# Patient Record
Sex: Male | Born: 1990 | Race: Black or African American | Hispanic: No | Marital: Single | Smoking: Never smoker
Health system: Southern US, Community
[De-identification: ages and names within clinical notes are randomized; demographics above are authoritative.]

## PROBLEM LIST (undated history)

## (undated) HISTORY — PX: WISDOM TOOTH EXTRACTION: SHX21

---

## 2014-09-27 ENCOUNTER — Ambulatory Visit (INDEPENDENT_AMBULATORY_CARE_PROVIDER_SITE_OTHER): Payer: Managed Care, Other (non HMO) | Admitting: Urgent Care

## 2014-09-27 VITALS — BP 130/88 | HR 62 | Temp 98.3°F | Resp 16 | Ht 62.5 in | Wt 176.0 lb

## 2014-09-27 DIAGNOSIS — R030 Elevated blood-pressure reading, without diagnosis of hypertension: Secondary | ICD-10-CM

## 2014-09-27 DIAGNOSIS — J020 Streptococcal pharyngitis: Secondary | ICD-10-CM

## 2014-09-27 DIAGNOSIS — J029 Acute pharyngitis, unspecified: Secondary | ICD-10-CM | POA: Diagnosis not present

## 2014-09-27 LAB — POCT RAPID STREP A (OFFICE): RAPID STREP A SCREEN: POSITIVE — AB

## 2014-09-27 MED ORDER — AMOXICILLIN 400 MG/5ML PO SUSR: 800.0000 mg | Freq: Two times a day (BID) | ORAL | Status: DC

## 2014-09-27 NOTE — Progress Notes (Signed)
    MRN: [id number] DOB: 19-Feb-1990  Subjective:   Tim Fox is a 24 y.o. male presenting for chief complaint of Sore Throat; Chills; and Dizziness  Sore throat - reports 2 day history of sore throat, slight non-productive cough, intermittent neck pain, fatigue. Has tried Tylenol with some relief. Denies fever, itchy or watery red eyes, sinus pain, ear pain, ear drainage, tooth pain, cough, chest pain, shob, wheezing, n/v, abdominal pain. Denies smoking, ~4 drinks of alcohol per week.   Elevated BP - has a history of elevated blood pressure. States that he has gotten down to exercise which she has not been doing for the past several months. He admits to really and healthy diet as well. ROS as above, also denies lower leg swelling, hematuria, chronic headaches, double vision, dizziness, numbness or tingling. Denies family history of heart disease, hypertension, diabetes.  Denies any other aggravating or relieving factors, no other questions or concerns.  Naseem currently has no medications in their medication list. Also has No Known Allergies.  Crescencio  has no past medical history on file. Also  has no past surgical history on file.  Objective:   Vitals: BP 166/116 mmHg  Pulse 62  Temp(Src) 98.3 F (36.8 C) (Oral)  Resp 16  Ht 5' 2.5" (1.588 m)  Wt 176 lb (79.833 kg)  BMI 31.66 kg/m2  SpO2 99%  Physical Exam  Constitutional: He is oriented to person, place, and time. He appears well-developed and well-nourished.  HENT:  TM's intact bilaterally, no effusions or erythema. Nasal turbinates pink and moist without sinus tenderness. Unable to visualize throat due severe gag reflex with tongue depressor. Patient does have redundant soft palate.  Eyes: Conjunctivae are normal. Right eye exhibits no discharge. Left eye exhibits no discharge. No scleral icterus.  Neck: Normal range of motion. Neck supple. No thyromegaly present.  Cardiovascular: Normal rate, regular rhythm and intact  distal pulses.  Exam reveals no gallop and no friction rub.   No murmur heard. Pulmonary/Chest: No respiratory distress. He has no wheezes. He has no rales.  Musculoskeletal: He exhibits no edema or tenderness.  Lymphadenopathy:    He has no cervical adenopathy.  Neurological: He is alert and oriented to person, place, and time.  Skin: Skin is warm and dry. No rash noted. No erythema. No pallor.   Results for orders placed or performed in visit on 09/27/14 (from the past 24 hour(s))  POCT rapid strep A     Status: Abnormal   Collection Time: 09/27/14  9:30 AM  Result Value Ref Range   Rapid Strep A Screen Positive (A) Negative   Assessment and Plan :   1. Strep throat 2. Sore throat - Start amoxicillin, patient cannot use pills so I rx'ed liquid amoxicillin. - Follow up as needed.  3. Elevated blood pressure reading without diagnosis of hypertension - Patient declined medication for now. States he will f/u with his PCP.  Wallis Bamberg, PA-C [facility] [phone] 09/27/2014 8:45 AM

## 2014-09-27 NOTE — Patient Instructions (Signed)

## 2017-09-15 ENCOUNTER — Emergency Department (HOSPITAL_COMMUNITY): Payer: 59

## 2017-09-15 ENCOUNTER — Emergency Department (HOSPITAL_COMMUNITY): Payer: 59 | Admitting: Anesthesiology

## 2017-09-15 ENCOUNTER — Observation Stay (HOSPITAL_COMMUNITY)
Admission: EM | Admit: 2017-09-15 | Discharge: 2017-09-16 | Disposition: A | Discharge status: Home / Self Care | Payer: 59 | Attending: Surgery | Admitting: Surgery

## 2017-09-15 ENCOUNTER — Encounter (HOSPITAL_COMMUNITY): Payer: Self-pay | Admitting: Emergency Medicine

## 2017-09-15 ENCOUNTER — Encounter (HOSPITAL_COMMUNITY)
Admission: EM | Disposition: A | Discharge status: Home / Self Care | Payer: Self-pay | Source: Home / Self Care | Attending: Emergency Medicine

## 2017-09-15 DIAGNOSIS — K3580 Unspecified acute appendicitis: Principal | ICD-10-CM | POA: Insufficient documentation

## 2017-09-15 DIAGNOSIS — K381 Appendicular concretions: Secondary | ICD-10-CM | POA: Diagnosis not present

## 2017-09-15 DIAGNOSIS — Z9049 Acquired absence of other specified parts of digestive tract: Secondary | ICD-10-CM

## 2017-09-15 DIAGNOSIS — Z23 Encounter for immunization: Secondary | ICD-10-CM | POA: Insufficient documentation

## 2017-09-15 HISTORY — PX: LAPAROSCOPIC APPENDECTOMY: SHX408

## 2017-09-15 LAB — CBC
HEMATOCRIT: 39.2 % (ref 39.0–52.0)
Hemoglobin: 13 g/dL (ref 13.0–17.0)
MCH: 30.5 pg (ref 26.0–34.0)
MCHC: 33.2 g/dL (ref 30.0–36.0)
MCV: 92 fL (ref 78.0–100.0)
Platelets: 358 10*3/uL (ref 150–400)
RBC: 4.26 MIL/uL (ref 4.22–5.81)
RDW: 12.6 % (ref 11.5–15.5)
WBC: 8.8 10*3/uL (ref 4.0–10.5)

## 2017-09-15 LAB — COMPREHENSIVE METABOLIC PANEL
ALBUMIN: 3.8 g/dL (ref 3.5–5.0)
ALT: 12 U/L (ref 0–44)
AST: 20 U/L (ref 15–41)
Alkaline Phosphatase: 70 U/L (ref 38–126)
Anion gap: 10 (ref 5–15)
BILIRUBIN TOTAL: 0.6 mg/dL (ref 0.3–1.2)
BUN: 11 mg/dL (ref 6–20)
CO2: 26 mmol/L (ref 22–32)
Calcium: 9.4 mg/dL (ref 8.9–10.3)
Chloride: 105 mmol/L (ref 98–111)
Creatinine, Ser: 1.15 mg/dL (ref 0.61–1.24)
GFR calc Af Amer: >60 mL/min (ref >60)
GLUCOSE: 82 mg/dL (ref 70–99)
POTASSIUM: 3.7 mmol/L (ref 3.5–5.1)
Sodium: 141 mmol/L (ref 135–145)
TOTAL PROTEIN: 7 g/dL (ref 6.5–8.1)

## 2017-09-15 LAB — URINALYSIS, ROUTINE W REFLEX MICROSCOPIC
Bilirubin Urine: NEGATIVE
Glucose, UA: NEGATIVE mg/dL
Hgb urine dipstick: NEGATIVE
KETONES UR: NEGATIVE mg/dL
LEUKOCYTES UA: NEGATIVE
NITRITE: NEGATIVE
PH: 5 (ref 5.0–8.0)
Protein, ur: NEGATIVE mg/dL
Specific Gravity, Urine: 1.024 (ref 1.005–1.030)

## 2017-09-15 LAB — LIPASE, BLOOD: Lipase: 27 U/L (ref 11–51)

## 2017-09-15 SURGERY — APPENDECTOMY, LAPAROSCOPIC
Anesthesia: General | Site: Abdomen

## 2017-09-15 MED ORDER — FENTANYL CITRATE (PF) 250 MCG/5ML IJ SOLN
INTRAMUSCULAR | Status: DC | PRN
  Administered 2017-09-15 (×4): 50 ug via INTRAVENOUS

## 2017-09-15 MED ORDER — DOCUSATE SODIUM 100 MG PO CAPS
200.0000 mg | ORAL_CAPSULE | Freq: Two times a day (BID) | ORAL | Status: DC
  Filled 2017-09-15: qty 2

## 2017-09-15 MED ORDER — DIPHENHYDRAMINE HCL 50 MG/ML IJ SOLN: 12.5000 mg | Freq: Four times a day (QID) | INTRAMUSCULAR | Status: DC | PRN

## 2017-09-15 MED ORDER — ONDANSETRON 4 MG PO TBDP: 4.0000 mg | ORAL_TABLET | Freq: Four times a day (QID) | ORAL | Status: DC | PRN

## 2017-09-15 MED ORDER — ONDANSETRON HCL 4 MG/2ML IJ SOLN: 4.0000 mg | Freq: Four times a day (QID) | INTRAMUSCULAR | Status: DC | PRN

## 2017-09-15 MED ORDER — IBUPROFEN 100 MG/5ML PO SUSP
600.0000 mg | Freq: Four times a day (QID) | ORAL | Status: DC | PRN
  Filled 2017-09-15: qty 30

## 2017-09-15 MED ORDER — ONDANSETRON HCL 4 MG/2ML IJ SOLN
INTRAMUSCULAR | Status: AC
  Filled 2017-09-15: qty 2

## 2017-09-15 MED ORDER — SIMETHICONE 80 MG PO CHEW: 40.0000 mg | CHEWABLE_TABLET | Freq: Four times a day (QID) | ORAL | Status: DC | PRN

## 2017-09-15 MED ORDER — ROCURONIUM BROMIDE 100 MG/10ML IV SOLN
INTRAVENOUS | Status: DC | PRN
  Administered 2017-09-15: 40 mg via INTRAVENOUS

## 2017-09-15 MED ORDER — GLYCOPYRROLATE PF 0.2 MG/ML IJ SOSY
PREFILLED_SYRINGE | INTRAMUSCULAR | Status: AC
  Filled 2017-09-15: qty 4

## 2017-09-15 MED ORDER — PROPOFOL 10 MG/ML IV BOLUS
INTRAVENOUS | Status: DC | PRN
  Administered 2017-09-15: 120 mg via INTRAVENOUS

## 2017-09-15 MED ORDER — MIDAZOLAM HCL 2 MG/2ML IJ SOLN
INTRAMUSCULAR | Status: AC
  Filled 2017-09-15: qty 2

## 2017-09-15 MED ORDER — HYDROMORPHONE HCL 1 MG/ML IJ SOLN: 0.2500 mg | INTRAMUSCULAR | Status: DC | PRN

## 2017-09-15 MED ORDER — HYDROMORPHONE HCL 1 MG/ML IJ SOLN: 0.5000 mg | INTRAMUSCULAR | Status: DC | PRN

## 2017-09-15 MED ORDER — FENTANYL CITRATE (PF) 250 MCG/5ML IJ SOLN
INTRAMUSCULAR | Status: AC
  Filled 2017-09-15: qty 5

## 2017-09-15 MED ORDER — SUCCINYLCHOLINE CHLORIDE 20 MG/ML IJ SOLN
INTRAMUSCULAR | Status: DC | PRN
  Administered 2017-09-15: 120 mg via INTRAVENOUS

## 2017-09-15 MED ORDER — PHENYLEPHRINE 40 MCG/ML (10ML) SYRINGE FOR IV PUSH (FOR BLOOD PRESSURE SUPPORT)
PREFILLED_SYRINGE | INTRAVENOUS | Status: AC
  Filled 2017-09-15: qty 10

## 2017-09-15 MED ORDER — EPHEDRINE 5 MG/ML INJ
INTRAVENOUS | Status: AC
  Filled 2017-09-15: qty 10

## 2017-09-15 MED ORDER — 0.9 % SODIUM CHLORIDE (POUR BTL) OPTIME
TOPICAL | Status: DC | PRN
  Administered 2017-09-15: 1000 mL

## 2017-09-15 MED ORDER — EPHEDRINE SULFATE 50 MG/ML IJ SOLN
INTRAMUSCULAR | Status: DC | PRN
  Administered 2017-09-15: 5 mg via INTRAVENOUS

## 2017-09-15 MED ORDER — ONDANSETRON HCL 4 MG/2ML IJ SOLN
INTRAMUSCULAR | Status: AC
  Filled 2017-09-15: qty 4

## 2017-09-15 MED ORDER — ONDANSETRON HCL 4 MG/2ML IJ SOLN
INTRAMUSCULAR | Status: DC | PRN
  Administered 2017-09-15: 4 mg via INTRAVENOUS

## 2017-09-15 MED ORDER — SUCCINYLCHOLINE CHLORIDE 200 MG/10ML IV SOSY
PREFILLED_SYRINGE | INTRAVENOUS | Status: AC
  Filled 2017-09-15: qty 10

## 2017-09-15 MED ORDER — MEPERIDINE HCL 50 MG/ML IJ SOLN: 6.2500 mg | INTRAMUSCULAR | Status: DC | PRN

## 2017-09-15 MED ORDER — SODIUM CHLORIDE 0.9 % IV SOLN
2.0000 g | Freq: Once | INTRAVENOUS | Status: AC
  Administered 2017-09-15: 2 g via INTRAVENOUS
  Filled 2017-09-15: qty 20

## 2017-09-15 MED ORDER — METRONIDAZOLE IN NACL 5-0.79 MG/ML-% IV SOLN
500.0000 mg | Freq: Once | INTRAVENOUS | Status: AC
  Administered 2017-09-15: 500 mg via INTRAVENOUS
  Filled 2017-09-15: qty 100

## 2017-09-15 MED ORDER — BUPIVACAINE-EPINEPHRINE 0.25% -1:200000 IJ SOLN
INTRAMUSCULAR | Status: AC
  Filled 2017-09-15: qty 1

## 2017-09-15 MED ORDER — LIDOCAINE HCL (CARDIAC) PF 100 MG/5ML IV SOSY
PREFILLED_SYRINGE | INTRAVENOUS | Status: DC | PRN
  Administered 2017-09-15: 100 mg via INTRATRACHEAL

## 2017-09-15 MED ORDER — ARTIFICIAL TEARS OPHTHALMIC OINT
TOPICAL_OINTMENT | OPHTHALMIC | Status: AC
  Filled 2017-09-15: qty 3.5

## 2017-09-15 MED ORDER — ONDANSETRON HCL 4 MG/2ML IJ SOLN: 4.0000 mg | Freq: Once | INTRAMUSCULAR | Status: DC | PRN

## 2017-09-15 MED ORDER — EPHEDRINE 5 MG/ML INJ
INTRAVENOUS | Status: AC
  Filled 2017-09-15: qty 20

## 2017-09-15 MED ORDER — SUGAMMADEX SODIUM 200 MG/2ML IV SOLN
INTRAVENOUS | Status: DC | PRN
  Administered 2017-09-15: 200 mg via INTRAVENOUS

## 2017-09-15 MED ORDER — DEXAMETHASONE SODIUM PHOSPHATE 10 MG/ML IJ SOLN
INTRAMUSCULAR | Status: DC | PRN
  Administered 2017-09-15: 10 mg via INTRAVENOUS

## 2017-09-15 MED ORDER — MIDAZOLAM HCL 5 MG/5ML IJ SOLN
INTRAMUSCULAR | Status: DC | PRN
  Administered 2017-09-15: 2 mg via INTRAVENOUS

## 2017-09-15 MED ORDER — SODIUM CHLORIDE 0.9 % IR SOLN
Status: DC | PRN
  Administered 2017-09-15: 1000 mL

## 2017-09-15 MED ORDER — HEPARIN SODIUM (PORCINE) 5000 UNIT/ML IJ SOLN
5000.0000 [IU] | Freq: Three times a day (TID) | INTRAMUSCULAR | Status: DC
  Administered 2017-09-16: 5000 [IU] via SUBCUTANEOUS

## 2017-09-15 MED ORDER — LIDOCAINE 2% (20 MG/ML) 5 ML SYRINGE
INTRAMUSCULAR | Status: AC
  Filled 2017-09-15: qty 5

## 2017-09-15 MED ORDER — ROCURONIUM BROMIDE 50 MG/5ML IV SOSY
PREFILLED_SYRINGE | INTRAVENOUS | Status: AC
  Filled 2017-09-15: qty 5

## 2017-09-15 MED ORDER — DIPHENHYDRAMINE HCL 12.5 MG/5ML PO ELIX: 12.5000 mg | ORAL_SOLUTION | Freq: Four times a day (QID) | ORAL | Status: DC | PRN

## 2017-09-15 MED ORDER — ACETAMINOPHEN 160 MG/5ML PO SOLN
650.0000 mg | Freq: Four times a day (QID) | ORAL | Status: DC
  Administered 2017-09-16: 650 mg via ORAL
  Filled 2017-09-15: qty 20.3

## 2017-09-15 MED ORDER — PROPOFOL 10 MG/ML IV BOLUS
INTRAVENOUS | Status: AC
  Filled 2017-09-15: qty 40

## 2017-09-15 MED ORDER — ROCURONIUM BROMIDE 50 MG/5ML IV SOSY
PREFILLED_SYRINGE | INTRAVENOUS | Status: AC
  Filled 2017-09-15: qty 10

## 2017-09-15 MED ORDER — LIDOCAINE 2% (20 MG/ML) 5 ML SYRINGE
INTRAMUSCULAR | Status: AC
  Filled 2017-09-15: qty 10

## 2017-09-15 MED ORDER — IOHEXOL 300 MG/ML  SOLN
100.0000 mL | Freq: Once | INTRAMUSCULAR | Status: AC | PRN
  Administered 2017-09-15: 100 mL via INTRAVENOUS

## 2017-09-15 MED ORDER — LACTATED RINGERS IV SOLN
INTRAVENOUS | Status: DC | PRN
  Administered 2017-09-15 (×2): via INTRAVENOUS

## 2017-09-15 MED ORDER — HYDRALAZINE HCL 20 MG/ML IJ SOLN: 10.0000 mg | INTRAMUSCULAR | Status: DC | PRN

## 2017-09-15 MED ORDER — BUPIVACAINE-EPINEPHRINE 0.25% -1:200000 IJ SOLN
INTRAMUSCULAR | Status: DC | PRN
  Administered 2017-09-15: 20 mL

## 2017-09-15 SURGICAL SUPPLY — 38 items
APPLIER CLIP 5 13 M/L LIGAMAX5 (MISCELLANEOUS)
BLADE CLIPPER SURG (BLADE) ×3 IMPLANT
CANISTER SUCT 3000ML PPV (MISCELLANEOUS) ×3 IMPLANT
CHLORAPREP W/TINT 26ML (MISCELLANEOUS) ×3 IMPLANT
CLIP APPLIE 5 13 M/L LIGAMAX5 (MISCELLANEOUS) IMPLANT
COVER SURGICAL LIGHT HANDLE (MISCELLANEOUS) ×3 IMPLANT
CUTTER FLEX LINEAR 45M (STAPLE) ×3 IMPLANT
DERMABOND ADVANCED (GAUZE/BANDAGES/DRESSINGS) ×2
DERMABOND ADVANCED .7 DNX12 (GAUZE/BANDAGES/DRESSINGS) ×1 IMPLANT
ELECT REM PT RETURN 9FT ADLT (ELECTROSURGICAL) ×3
ELECTRODE REM PT RTRN 9FT ADLT (ELECTROSURGICAL) ×1 IMPLANT
GLOVE BIO SURGEON STRL SZ7.5 (GLOVE) ×3 IMPLANT
GLOVE INDICATOR 8.0 STRL GRN (GLOVE) ×3 IMPLANT
GOWN STRL REUS W/ TWL LRG LVL3 (GOWN DISPOSABLE) ×2 IMPLANT
GOWN STRL REUS W/ TWL XL LVL3 (GOWN DISPOSABLE) ×1 IMPLANT
GOWN STRL REUS W/TWL LRG LVL3 (GOWN DISPOSABLE) ×4
GOWN STRL REUS W/TWL XL LVL3 (GOWN DISPOSABLE) ×2
KIT BASIN OR (CUSTOM PROCEDURE TRAY) ×3 IMPLANT
KIT TURNOVER KIT B (KITS) ×3 IMPLANT
NS IRRIG 1000ML POUR BTL (IV SOLUTION) ×3 IMPLANT
PAD ARMBOARD 7.5X6 YLW CONV (MISCELLANEOUS) ×6 IMPLANT
POUCH SPECIMEN RETRIEVAL 10MM (ENDOMECHANICALS) ×3 IMPLANT
RELOAD 45 VASCULAR/THIN (ENDOMECHANICALS) IMPLANT
RELOAD STAPLE TA45 3.5 REG BLU (ENDOMECHANICALS) IMPLANT
SCISSORS LAP 5X35 DISP (ENDOMECHANICALS) IMPLANT
SET IRRIG TUBING LAPAROSCOPIC (IRRIGATION / IRRIGATOR) ×3 IMPLANT
SHEARS HARMONIC ACE PLUS 36CM (ENDOMECHANICALS) IMPLANT
SLEEVE ENDOPATH XCEL 5M (ENDOMECHANICALS) ×3 IMPLANT
SPECIMEN JAR SMALL (MISCELLANEOUS) ×3 IMPLANT
SUT MNCRL AB 4-0 PS2 18 (SUTURE) ×3 IMPLANT
TOWEL OR 17X24 6PK STRL BLUE (TOWEL DISPOSABLE) ×3 IMPLANT
TOWEL OR 17X26 10 PK STRL BLUE (TOWEL DISPOSABLE) ×3 IMPLANT
TRAY FOLEY CATH SILVER 16FR (SET/KITS/TRAYS/PACK) ×3 IMPLANT
TRAY LAPAROSCOPIC MC (CUSTOM PROCEDURE TRAY) ×3 IMPLANT
TROCAR XCEL BLUNT TIP 100MML (ENDOMECHANICALS) ×3 IMPLANT
TROCAR XCEL NON-BLD 5MMX100MML (ENDOMECHANICALS) ×3 IMPLANT
TUBING INSUFFLATION (TUBING) ×3 IMPLANT
WATER STERILE IRR 1000ML POUR (IV SOLUTION) ×3 IMPLANT

## 2017-09-15 NOTE — ED Notes (Signed)
Patient transported to CT 

## 2017-09-15 NOTE — H&P (Signed)
CC: RLQ pain; consulted by EDP for acute appendicitis  HPI: Tim Fox is an 27 y.o. male who denies any prior medical hx presented to the ED with a 3d hx of RLQ abdominal pain. Wasn't getting worse but wasn't getting better so he came in at the urging of his boss for further evaluation. Denies fevers; +chills. Denies n/v. Never had this before. The pain does not radiate. The pain is somewhat sharp. Nothing makes it better/worse.  He denies ever having had any surgeries before  History reviewed. No pertinent past medical history.  History reviewed. No pertinent surgical history.  Family History  Problem Relation Age of Onset  . Diabetes Maternal Grandmother     Social:  reports that he has never smoked. He has never used smokeless tobacco. He reports that he drinks about 4.0 standard drinks of alcohol per week. He reports that he does not use drugs.  He works in [employer], corporate locally  Allergies: No Known Allergies  Medications: I have reviewed the patient's current medications.  Results for orders placed or performed during the hospital encounter of 09/15/17 (from the past 48 hour(s))  Lipase, blood     Status: None   Collection Time: 09/15/17  3:50 PM  Result Value Ref Range   Lipase 27 11 - 51 U/L    Comment: Performed at [facility], [place] [address], [place], [place] [address]  Comprehensive metabolic panel     Status: None   Collection Time: 09/15/17  3:50 PM  Result Value Ref Range   Sodium 141 135 - 145 mmol/L   Potassium 3.7 3.5 - 5.1 mmol/L   Chloride 105 98 - 111 mmol/L   CO2 26 22 - 32 mmol/L   Glucose, Bld 82 70 - 99 mg/dL   BUN 11 6 - 20 mg/dL   Creatinine, Ser 1.15 0.61 - 1.24 mg/dL   Calcium 9.4 8.9 - 10.3 mg/dL   Total Protein 7.0 6.5 - 8.1 g/dL   Albumin 3.8 3.5 - 5.0 g/dL   AST 20 15 - 41 U/L   ALT 12 0 - 44 U/L   Alkaline Phosphatase 70 38 - 126 U/L   Total Bilirubin 0.6 0.3 - 1.2 mg/dL   GFR calc non Af Amer >60 >60 mL/min   GFR  calc Af Amer >60 >60 mL/min    Comment: (NOTE) The eGFR has been calculated using the CKD EPI equation. This calculation has not been validated in all clinical situations. eGFR's persistently <60 mL/min signify possible Chronic Kidney Disease.    Anion gap 10 5 - 15    Comment: Performed at [facility] [address], [place] [address]  CBC     Status: None   Collection Time: 09/15/17  3:50 PM  Result Value Ref Range   WBC 8.8 4.0 - 10.5 K/uL   RBC 4.26 4.22 - 5.81 MIL/uL   Hemoglobin 13.0 13.0 - 17.0 g/dL   HCT 39.2 39.0 - 52.0 %   MCV 92.0 78.0 - 100.0 fL   MCH 30.5 26.0 - 34.0 pg   MCHC 33.2 30.0 - 36.0 g/dL   RDW 12.6 11.5 - 15.5 %   Platelets 358 150 - 400 K/uL    Comment: Performed at [facility] [address], [place], [place] [address]  Urinalysis, Routine w reflex microscopic     Status: None   Collection Time: 09/15/17  5:40 PM  Result Value Ref Range   Color,  Urine YELLOW YELLOW   APPearance CLEAR CLEAR   Specific Gravity, Urine 1.024 1.005 - 1.030   pH 5.0 5.0 - 8.0   Glucose, UA NEGATIVE NEGATIVE mg/dL   Hgb urine dipstick NEGATIVE NEGATIVE   Bilirubin Urine NEGATIVE NEGATIVE   Ketones, ur NEGATIVE NEGATIVE mg/dL   Protein, ur NEGATIVE NEGATIVE mg/dL   Nitrite NEGATIVE NEGATIVE   Leukocytes, UA NEGATIVE NEGATIVE    Comment: Performed at [facility] [address], [place], [place] [address]    Ct Abdomen Pelvis W Contrast  Result Date: 09/15/2017 CLINICAL DATA:  Right lower quadrant abdominal pain starting on Monday. EXAM: CT ABDOMEN AND PELVIS WITH CONTRAST TECHNIQUE: Multidetector CT imaging of the abdomen and pelvis was performed using the standard protocol following bolus administration of intravenous contrast. CONTRAST:  191m OMNIPAQUE IOHEXOL 300 MG/ML  SOLN COMPARISON:  None. FINDINGS: Lower chest: Mild cardiomegaly. Hepatobiliary: Unremarkable Pancreas: Unremarkable Spleen: Unremarkable Adrenals/Urinary Tract:  Unremarkable Stomach/Bowel: Acute appendicitis is present with appendiceal diameter of 2.1 cm, and with a 1.0 cm appendicolith in the appendiceal tip. There is inflammatory periappendiceal stranding and the degree of inflammation is most pronounced near the appendiceal tip. Small amount of periappendiceal fluid. No discrete abscess or extraluminal gas. Vascular/Lymphatic: Unremarkable Reproductive: Unremarkable Other: No supplemental non-categorized findings. Musculoskeletal: Unremarkable IMPRESSION: 1. Acute appendicitis, with prominent inflammatory thickening of the distal appendix and a notable appendicolith near the appendiceal tip. There is surrounding periappendiceal inflammatory stranding and a small amount of adjacent fluid, but no extraluminal gas or abscess. 2. Mild cardiomegaly. Electronically Signed   By: WVan ClinesM.D.   On: 09/15/2017 20:02    ROS - all of the below systems have been reviewed with the patient and positives are indicated with bold text General: chills, fever or night sweats Eyes: blurry vision or double vision ENT: epistaxis or sore throat Allergy/Immunology: itchy/watery eyes or nasal congestion Hematologic/Lymphatic: bleeding problems, blood clots or swollen lymph nodes Endocrine: temperature intolerance or unexpected weight changes Breast: new or changing breast lumps or nipple discharge Resp: cough, shortness of breath, or wheezing CV: chest pain or dyspnea on exertion GI: as per HPI GU: dysuria, trouble voiding, or hematuria MSK: joint pain or joint stiffness Neuro: TIA or stroke symptoms Derm: pruritus and skin lesion changes Psych: anxiety and depression  PE Blood pressure (!) 155/107, pulse 63, temperature 98.2 F (36.8 C), temperature source Oral, resp. rate 16, SpO2 100 %. Constitutional: NAD; conversant; no deformities Eyes: Moist conjunctiva; no lid lag; anicteric; PERRL Neck: Trachea midline; no thyromegaly Lungs: Normal respiratory  effort; no tactile fremitus CV: RRR; no palpable thrills; no pitting edema GI: Abd soft, mildly ttp in RLQ; negative Rovsing's; negative Psoas sign; no rebound, no guarding, no tenderness elsewhere; no palpable hepatosplenomegaly. Nondistended MSK: Normal gait; no clubbing/cyanosis Psychiatric: Appropriate affect; alert and oriented x3 Lymphatic: No palpable cervical or axillary lymphadenopathy  Results for orders placed or performed during the hospital encounter of 09/15/17 (from the past 48 hour(s))  Lipase, blood     Status: None   Collection Time: 09/15/17  3:50 PM  Result Value Ref Range   Lipase 27 11 - 51 U/L    Comment: Performed at [facility] [id number] [place] [id number] Comprehensive metabolic panel     Status: None   Collection Time: 09/15/17  3:50 PM  Result Value Ref Range   Sodium 141 135 - 145 mmol/L   Potassium 3.7 3.5 - 5.1 mmol/L  Chloride 105 98 - 111 mmol/L   CO2 26 22 - 32 mmol/L   Glucose, Bld 82 70 - 99 mg/dL   BUN 11 6 - 20 mg/dL   Creatinine, Ser 1.15 0.61 - 1.24 mg/dL   Calcium 9.4 8.9 - 10.3 mg/dL   Total Protein 7.0 6.5 - 8.1 g/dL   Albumin 3.8 3.5 - 5.0 g/dL   AST 20 15 - 41 U/L   ALT 12 0 - 44 U/L   Alkaline Phosphatase 70 38 - 126 U/L   Total Bilirubin 0.6 0.3 - 1.2 mg/dL   GFR calc non Af Amer >60 >60 mL/min   GFR calc Af Amer >60 >60 mL/min    Comment: (NOTE) The eGFR has been calculated using the CKD EPI equation. This calculation has not been validated in all clinical situations. eGFR's persistently <60 mL/min signify possible Chronic Kidney Disease.    Anion gap 10 5 - 15    Comment: Performed at [facility] [address], [place] [address]  CBC     Status: None   Collection Time: 09/15/17  3:50 PM  Result Value Ref Range   WBC 8.8 4.0 - 10.5 K/uL   RBC 4.26 4.22 - 5.81 MIL/uL   Hemoglobin 13.0 13.0 - 17.0 g/dL   HCT 39.2 39.0 - 52.0 %   MCV 92.0 78.0 - 100.0 fL   MCH 30.5 26.0 - 34.0 pg    MCHC 33.2 30.0 - 36.0 g/dL   RDW 12.6 11.5 - 15.5 %   Platelets 358 150 - 400 K/uL    Comment: Performed at [facility] [address], [place], [place] [address]  Urinalysis, Routine w reflex microscopic     Status: None   Collection Time: 09/15/17  5:40 PM  Result Value Ref Range   Color, Urine YELLOW YELLOW   APPearance CLEAR CLEAR   Specific Gravity, Urine 1.024 1.005 - 1.030   pH 5.0 5.0 - 8.0   Glucose, UA NEGATIVE NEGATIVE mg/dL   Hgb urine dipstick NEGATIVE NEGATIVE   Bilirubin Urine NEGATIVE NEGATIVE   Ketones, ur NEGATIVE NEGATIVE mg/dL   Protein, ur NEGATIVE NEGATIVE mg/dL   Nitrite NEGATIVE NEGATIVE   Leukocytes, UA NEGATIVE NEGATIVE    Comment: Performed at [facility] [address], [place], [place] [address]    Ct Abdomen Pelvis W Contrast  Result Date: 09/15/2017 CLINICAL DATA:  Right lower quadrant abdominal pain starting on Monday. EXAM: CT ABDOMEN AND PELVIS WITH CONTRAST TECHNIQUE: Multidetector CT imaging of the abdomen and pelvis was performed using the standard protocol following bolus administration of intravenous contrast. CONTRAST:  132m OMNIPAQUE IOHEXOL 300 MG/ML  SOLN COMPARISON:  None. FINDINGS: Lower chest: Mild cardiomegaly. Hepatobiliary: Unremarkable Pancreas: Unremarkable Spleen: Unremarkable Adrenals/Urinary Tract: Unremarkable Stomach/Bowel: Acute appendicitis is present with appendiceal diameter of 2.1 cm, and with a 1.0 cm appendicolith in the appendiceal tip. There is inflammatory periappendiceal stranding and the degree of inflammation is most pronounced near the appendiceal tip. Small amount of periappendiceal fluid. No discrete abscess or extraluminal gas. Vascular/Lymphatic: Unremarkable Reproductive: Unremarkable Other: No supplemental non-categorized findings. Musculoskeletal: Unremarkable IMPRESSION: 1. Acute appendicitis, with prominent inflammatory thickening of the distal appendix and a notable appendicolith near the  appendiceal tip. There is surrounding periappendiceal inflammatory stranding and a small amount of adjacent fluid, but no extraluminal gas or abscess. 2. Mild cardiomegaly. Electronically Signed   By: WVan ClinesM.D.   On: 09/15/2017 20:02  A/P: Tim Fox is an 27 y.o. male with acute appendicitis - no evidence of perforation on imaging  -The anatomy and physiology of the GI tract was discussed at length with the patient. The pathophysiology of appendicitis was discussed at length as well. -I discussed options moving forward for treatment including observation with IV abx vs surgery. We discussed that with antibiotics alone, there is reasonable success in managing appendicitis without needing an operation but in his case, an appendicolith was seen which increases chances of antibiotic alone failure. We discussed risks of recurrence at 16yr being as high as 40% in some studies (APPAC trial in cases of acute uncomplicated appendicitis - exclusions being perforation, abscess, suspected malignancy, appenddicolith, age <18 or >60, peritonitis, serious comorbidities). We discussed the surgical procedure including laparoscopic and potential open techniques. We discussed the material risks (including, but not limited to, pain, bleeding, infection, scarring, need for blood transfusion, damage to surrounding structures- blood vessels/nerves/viscus/organs, damage to ureter/bladder, urine leak, leak from staple line, need for additional procedures, need for stoma which may be permanent, hernia, recurrence although quite low, pneumonia, heart attack, stroke, death) benefits and alternatives to surgery were discussed at length. The patient's questions were answered to his satisfaction, he voiced understanding and elected to proceed with surgery. Additionally, we discussed typical postoperative expectations and the recovery process. -Will plan laparoscopic, possible open, appendectomy  CSharon Mt  WDema Severin M.D. [facility]

## 2017-09-15 NOTE — ED Notes (Signed)
Patients belongings inventoried and brought to security per short stay request

## 2017-09-15 NOTE — Anesthesia Preprocedure Evaluation (Signed)
Anesthesia Evaluation  Patient identified by MRN, date of birth, ID band Patient awake    Reviewed: Allergy & Precautions, NPO status , Patient's Chart, lab work & pertinent test results  Airway Mallampati: I  TM Distance: >3 FB Neck ROM: Full    Dental   Pulmonary    Pulmonary exam normal        Cardiovascular Normal cardiovascular exam     Neuro/Psych    GI/Hepatic   Endo/Other    Renal/GU      Musculoskeletal   Abdominal   Peds  Hematology   Anesthesia Other Findings   Reproductive/Obstetrics                             Anesthesia Physical Anesthesia Plan  ASA: II and emergent  Anesthesia Plan: General   Post-op Pain Management:    Induction: Intravenous, Rapid sequence and Cricoid pressure planned  PONV Risk Score and Plan: 2 and Ondansetron and Treatment may vary due to age or medical condition  Airway Management Planned: Oral ETT  Additional Equipment:   Intra-op Plan:   Post-operative Plan: Extubation in OR  Informed Consent: I have reviewed the patients History and Physical, chart, labs and discussed the procedure including the risks, benefits and alternatives for the proposed anesthesia with the patient or authorized representative who has indicated his/her understanding and acceptance.     Plan Discussed with: CRNA and Surgeon  Anesthesia Plan Comments:         Anesthesia Quick Evaluation

## 2017-09-15 NOTE — ED Triage Notes (Signed)
Pt presents to ED for assessment of RLQ pain, sent from [facility] for r/o appendicitis.  Denies n/v/d, denies changes in urination.  Pain since Monday, comes and goes, worsening each time it comes.

## 2017-09-15 NOTE — ED Notes (Signed)
Dr. Cliffton Asters with [facility] bedside for evaluation. Pt requesting to walk out to his car to get his work phone before antibiotics are initiated.

## 2017-09-15 NOTE — Anesthesia Procedure Notes (Signed)
Procedure Name: Intubation Date/Time: 09/15/2017 9:54 PM Performed by: Claudina Lick, CRNA Pre-anesthesia Checklist: Patient identified, Emergency Drugs available, Suction available, Patient being monitored and Timeout performed Patient Re-evaluated:Patient Re-evaluated prior to induction Oxygen Delivery Method: Circle system utilized Preoxygenation: Pre-oxygenation with 100% oxygen Induction Type: IV induction, Rapid sequence and Cricoid Pressure applied Laryngoscope Size: Miller and 2 Grade View: Grade I Tube type: Oral Tube size: 7.5 mm Number of attempts: 1 Airway Equipment and Method: Stylet Placement Confirmation: ETT inserted through vocal cords under direct vision,  positive ETCO2 and breath sounds checked- equal and bilateral Secured at: 22 cm Tube secured with: Tape Dental Injury: Teeth and Oropharynx as per pre-operative assessment

## 2017-09-15 NOTE — ED Provider Notes (Signed)
[facility] EMERGENCY DEPARTMENT Provider Note   CSN: [id number] Arrival date & time: 09/15/17  1503     History   Chief Complaint Chief Complaint  Patient presents with  . Abdominal Pain    HPI Tim Fox is a 27 y.o. male.  HPI   27 year old male presents today with complaints of abdominal pain.  Patient has 3-day history of worsening abdominal pain started very umbilical localizing down to the right lower quadrant.  Symptoms worse with movement.  He notes decreased appetite.  He denies any nausea or vomiting, denies any fever.  No urinary symptoms, no history of abdominal surgeries.  No daily medications, no pain medications prior to arrival.  Patient notes that he did have water this morning, but no solid foods to eat today.     History reviewed. No pertinent past medical history.  Patient Active Problem List   Diagnosis Date Noted  . Elevated blood pressure reading without diagnosis of hypertension 09/27/2014    History reviewed. No pertinent surgical history.      Home Medications    Prior to Admission medications   Medication Sig Start Date End Date Taking? Authorizing Provider  amoxicillin (AMOXIL) 400 MG/5ML suspension Take 10 mLs (800 mg total) by mouth 2 (two) times daily. 09/27/14   Wallis Bamberg, PA-C    Family History Family History  Problem Relation Age of Onset  . Diabetes Maternal Grandmother     Social History Social History   Tobacco Use  . Smoking status: Never Smoker  . Smokeless tobacco: Never Used  Substance Use Topics  . Alcohol use: Yes    Alcohol/week: 4.0 standard drinks    Types: 4 Standard drinks or equivalent per week  . Drug use: No     Allergies   Patient has no known allergies.   Review of Systems Review of Systems  All other systems reviewed and are negative.   Physical Exam Updated Vital Signs BP (!) 155/107 (BP Location: Right Arm)   Pulse 63   Temp 98.2 F (36.8 C) (Oral)   Resp 16    SpO2 100%   Physical Exam  Constitutional: He is oriented to person, place, and time. He appears well-developed and well-nourished.  HENT:  Head: Normocephalic and atraumatic.  Eyes: Pupils are equal, round, and reactive to light. Conjunctivae are normal. Right eye exhibits no discharge. Left eye exhibits no discharge. No scleral icterus.  Neck: Normal range of motion. No JVD present. No tracheal deviation present.  Pulmonary/Chest: Effort normal. No stridor.  Abdominal:  Acute tenderness to palpation of the right lower quadrant remainder of abdomen soft nontender  Neurological: He is alert and oriented to person, place, and time. Coordination normal.  Psychiatric: He has a normal mood and affect. His behavior is normal. Judgment and thought content normal.  Nursing note and vitals reviewed.    ED Treatments / Results  Labs (all labs ordered are listed, but only abnormal results are displayed) Labs Reviewed  LIPASE, BLOOD  COMPREHENSIVE METABOLIC PANEL  CBC  URINALYSIS, ROUTINE W REFLEX MICROSCOPIC    EKG None  Radiology Ct Abdomen Pelvis W Contrast  Result Date: 09/15/2017 CLINICAL DATA:  Right lower quadrant abdominal pain starting on Monday. EXAM: CT ABDOMEN AND PELVIS WITH CONTRAST TECHNIQUE: Multidetector CT imaging of the abdomen and pelvis was performed using the standard protocol following bolus administration of intravenous contrast. CONTRAST:  OMNIPAQUE IOHEXOL 300 MG/ML  SOLN COMPARISON:  None. FINDINGS: Lower chest: Mild cardiomegaly. Hepatobiliary:  Unremarkable Pancreas: Unremarkable Spleen: Unremarkable Adrenals/Urinary Tract: Unremarkable Stomach/Bowel: Acute appendicitis is present with appendiceal diameter of 2.1 cm, and with a 1.0 cm appendicolith in the appendiceal tip. There is inflammatory periappendiceal stranding and the degree of inflammation is most pronounced near the appendiceal tip. Small amount of periappendiceal fluid. No discrete abscess or  extraluminal gas. Vascular/Lymphatic: Unremarkable Reproductive: Unremarkable Other: No supplemental non-categorized findings. Musculoskeletal: Unremarkable IMPRESSION: 1. Acute appendicitis, with prominent inflammatory thickening of the distal appendix and a notable appendicolith near the appendiceal tip. There is surrounding periappendiceal inflammatory stranding and a small amount of adjacent fluid, but no extraluminal gas or abscess. 2. Mild cardiomegaly. Electronically Signed   By: Gaylyn Rong M.D.   On: 09/15/2017 20:02    Procedures Procedures (including critical care time)  Medications Ordered in ED Medications  cefTRIAXone (ROCEPHIN) 2 g in sodium chloride 0.9 % 100 mL IVPB (has no administration in time range)    And  metroNIDAZOLE (FLAGYL) IVPB 500 mg (has no administration in time range)  iohexol (OMNIPAQUE) 300 MG/ML solution 100 mL (100 mLs Intravenous Contrast Given 09/15/17 1930)     Initial Impression / Assessment and Plan / ED Course  I have reviewed the triage vital signs and the nursing notes.  Pertinent labs & imaging results that were available during my care of the patient were reviewed by me and considered in my medical decision making (see chart for details).     Labs: Urinalysis, CBC, CMP, lipase  Imaging: CT abdomen pelvis with contrast  Consults:  Therapeutics: Ceftriaxone, metronidazole  Discharge Meds:   Assessment/Plan: 27 year old male presents today with acute appendicitis-no signs of perforation, afebrile with no tachycardia or elevation in white count.  Patient will be started on ceftriaxone and metronidazole, general surgery consulted who would evaluate the patient at bedside.      Final Clinical Impressions(s) / ED Diagnoses   Final diagnoses:  Acute appendicitis, unspecified acute appendicitis type    ED Discharge Orders    None       Rosalio Loud 09/15/17 2109    Maia Plan, MD 09/16/17 1314

## 2017-09-15 NOTE — Op Note (Signed)
Tim Fox [id number]   PRE-OPERATIVE DIAGNOSIS:  appendicitis  POST-OPERATIVE DIAGNOSIS:  appendicitis  Procedure(s): APPENDECTOMY LAPAROSCOPIC  SURGEON:  Stephanie Coup. Javoris Star, M.D.  ASSISTANT: Scrub nurse  ANESTHESIA: General endotracheal  EBL:   5cc  DRAINS: None  SPECIMEN:  Appendix  COUNTS:  Sponge, needle and instrument counts were reported correct x2 at conclusion of the operation  DISPOSITION:  PACU in satisfactory condition  COMPLICATIONS: None  FINDINGS: Likely prior umbilical hernia repair given retained permanent suture at this location although he denied prior surgical history and believed his skin scar around his umbilicus was from skin trauma. Dilated, inflamed appearing appendix in distal half. No perforation or murky ascites.  INDICATIONS: Tim Fox is a very pleasant 27yoM whom presented to the ED with a 3d hx of RLQ pain. He underwent evaluation in the ER and was noted to be afebrile with normal wbc. He underwent CT scan a/p which demonstrated findings acute appendicitis with prominent inflammatory thickening of the distal appendix. Options were discussed and he opted to pursue appendectomy. Please refer to H&P for details regarding this discussion.  DESCRIPTION:   The patient was identified & brought into the operating room. SCDs were in place and functioning. General endotracheal anesthesia was administered. Preoperative antibiotics were administered. The patient was positioned supine with left arm tucked. A foley catheter was inserted under sterile conditions. The abdomen was prepped and draped in the standard sterile fashion. A surgical timeout confirmed our plan.  A small incision was made in the supraumbilical skin. The subcutaneous tissue was dissected and the umbilical stalk identified. The stalk was grasped with a Kocher and retracted outwardly. The infraumbilical fascia was exposed and incised. Peritoneal entry was carefully made bluntly. An 0  Vicryl purse-string suture was placed and then the [facility] port was introduced into the abdomen.  CO2 insufflation commenced to . The laparoscope was inserted and confirmed no evidence of injury. The patient was then positioned in Trendelenburg. Two additional ports were placed - one in left lower quadrant and another in the suprapubic midline. The patient was then placed in the left side down position.  Omental adhesions to the anterior abdominal wall were taken down carefully.  The appendix was readily identified and swept off the abdominal wall where it was adhered. It was then elevated.  The base of the appendix was circumferentially dissected. The base was noted to be viable and healthy appearing. The intervening appendiceal mesentery was ligated with the Harmonic scalpel. The appendiceal mesentery was observed and noted to be completely hemostatic.  The appendix was then stapled with a blue load, taking the appendix at its base flush with the cecum. The appendix was placed in an EndoBag and extracted through the umbilical port site.  The right lower quadrant was irrigated. Hemostasis was noted to be achieved - taking time to inspect the ligated mesoappendix, colon mesentery, and retroperitoneum. Staple line was noted to be intact on the cecum with no bleeding. There was no perforation or injury. The right lower quadrant appeared clean and as such, no drain was placed. The omentum was placed back down over the cecum  The left lower quadrant and suprapubic ports were removed under direct visualization. The CO2 was exhausted from the abdomen. The umbilical fascia was then closed using 0 Vicryl suture. The fascia was palpated and noted to be closed completely. The skin of all port sites was then approximated using 4-0 Monocryl suture. The incisions were dressed with Dermabond.  The patient was then extubated  and transferred to a stretcher for transport to recover in satisfactory condition.

## 2017-09-15 NOTE — Transfer of Care (Signed)
Immediate Anesthesia Transfer of Care Note  Patient: Tim Fox  Procedure(s) Performed: APPENDECTOMY LAPAROSCOPIC (N/A Abdomen)  Patient Location: PACU  Anesthesia Type:General  Level of Consciousness: awake  Airway & Oxygen Therapy: Patient Spontanous Breathing  Post-op Assessment: Report given to RN and Post -op Vital signs reviewed and stable  Post vital signs: Reviewed and stable  Last Vitals:  Vitals Value Taken Time  BP    Temp    Pulse 84 09/15/2017 11:01 PM  Resp 15 09/15/2017 11:01 PM  SpO2 100 % 09/15/2017 11:01 PM  Vitals shown include unvalidated device data.  Last Pain:  Vitals:   09/15/17 1526  TempSrc:   PainSc: 7          Complications: No apparent anesthesia complications

## 2017-09-15 NOTE — ED Provider Notes (Signed)
Patient placed in Quick Look pathway, seen and evaluated   Chief Complaint: abdominal pain  HPI:   Jonathyn Bacus is a 27 y.o. male who presents to the ED from urgent care for abdominal pain. The pain started 3 days ago and was generalized pain. Today the pain is in the RLQ. Patient denies n/v/d. No fever.   ROS: GI: abdominal pain  Physical Exam:  BP (!) 155/107 (BP Location: Right Arm)   Pulse 63   Temp 98.2 F (36.8 C) (Oral)   Resp 16   SpO2 100%    Gen: No distress  Neuro: Awake and Alert  Skin: Warm and dry  Abdomen: soft, tender with palpation RLQ    Initiation of care has begun. The patient has been counseled on the process, plan, and necessity for staying for the completion/evaluation, and the remainder of the medical screening examination    Janne Napoleon, NP 09/15/17 1531    Vanetta Mulders, MD 09/18/17 [phone]

## 2017-09-16 ENCOUNTER — Other Ambulatory Visit: Payer: Self-pay

## 2017-09-16 ENCOUNTER — Encounter (HOSPITAL_COMMUNITY): Payer: Self-pay | Admitting: Surgery

## 2017-09-16 LAB — BASIC METABOLIC PANEL
ANION GAP: 12 (ref 5–15)
BUN: 10 mg/dL (ref 6–20)
CALCIUM: 9.3 mg/dL (ref 8.9–10.3)
CHLORIDE: 102 mmol/L (ref 98–111)
CO2: 25 mmol/L (ref 22–32)
CREATININE: 1.1 mg/dL (ref 0.61–1.24)
GFR calc non Af Amer: >60 mL/min (ref >60)
Glucose, Bld: 102 mg/dL — ABNORMAL HIGH (ref 70–99)
Potassium: 3.8 mmol/L (ref 3.5–5.1)
SODIUM: 139 mmol/L (ref 135–145)

## 2017-09-16 LAB — CBC
HCT: 39.8 % (ref 39.0–52.0)
HEMOGLOBIN: 13.4 g/dL (ref 13.0–17.0)
MCH: 30.6 pg (ref 26.0–34.0)
MCHC: 33.7 g/dL (ref 30.0–36.0)
MCV: 90.9 fL (ref 78.0–100.0)
Platelets: 364 10*3/uL (ref 150–400)
RBC: 4.38 MIL/uL (ref 4.22–5.81)
RDW: 12.5 % (ref 11.5–15.5)
WBC: 9.9 10*3/uL (ref 4.0–10.5)

## 2017-09-16 MED ORDER — OXYCODONE HCL 5 MG/5ML PO SOLN: 5.0000 mg | ORAL | Status: DC | PRN

## 2017-09-16 MED ORDER — ACETAMINOPHEN 160 MG/5ML PO SOLN: ORAL | 0 refills | Status: AC

## 2017-09-16 MED ORDER — ACETAMINOPHEN 160 MG/5ML PO SOLN: 1000.0000 mg | Freq: Three times a day (TID) | ORAL | Status: DC | PRN

## 2017-09-16 MED ORDER — INFLUENZA VAC SPLIT QUAD 0.5 ML IM SUSY
0.5000 mL | PREFILLED_SYRINGE | INTRAMUSCULAR | Status: AC
  Administered 2017-09-16: 0.5 mL via INTRAMUSCULAR
  Filled 2017-09-16: qty 0.5

## 2017-09-16 MED ORDER — OXYCODONE HCL 5 MG/5ML PO SOLN: 5.0000 mg | ORAL | 0 refills | Status: AC | PRN

## 2017-09-16 NOTE — Anesthesia Postprocedure Evaluation (Signed)
Anesthesia Post Note  Patient: Robyne PeersDesmond Novakowski  Procedure(s) Performed: APPENDECTOMY LAPAROSCOPIC (N/A Abdomen)     Patient location during evaluation: PACU Anesthesia Type: General Level of consciousness: awake and alert Pain management: pain level controlled Vital Signs Assessment: post-procedure vital signs reviewed and stable Respiratory status: spontaneous breathing, nonlabored ventilation, respiratory function stable and patient connected to nasal cannula oxygen Cardiovascular status: blood pressure returned to baseline and stable Postop Assessment: no apparent nausea or vomiting Anesthetic complications: no    Last Vitals:  Vitals:   09/15/17 2318 09/15/17 2332  BP: (!) 161/98 (!) 165/86  Pulse: 74 68  Resp: 17 17  Temp:  36.5 C  SpO2: 100% 100%    Last Pain:  Vitals:   09/15/17 2357  TempSrc:   PainSc: 0-No pain                 Otha Rickles DAVID

## 2017-09-16 NOTE — Progress Notes (Signed)
Discharge home. Home discharge instruction given, no question verbalized. 

## 2017-09-16 NOTE — Plan of Care (Signed)

## 2017-09-16 NOTE — Progress Notes (Signed)
1 Day Post-Op    CC: Acute appendicitis  Subjective: Patient is doing well after his appendectomy.  He needs a physical exam for his work.  He is doing well from his appendectomy.  Tolerating diet and ready to go home.  He says he has a history of intermittent elevated blood pressures.  He also says he cannot swallow pills.  Objective: Vital signs in last 24 hours: Temp:  [97.7 F (36.5 C)-98.2 F (36.8 C)] 98.1 F (36.7 C) (09/12 0800) Pulse Rate:  [63-79] 63 (09/12 0800) Resp:  [13-17] 17 (09/11 2332) BP: (131-179)/(79-107) 131/79 (09/12 0800) SpO2:  [99 %-100 %] 99 % (09/12 0800) Last BM Date: 09/16/17 1200 IV Nothing p.o. Recorded. 250 urine Afebrile vital signs are stable blood pressure is up on admission.  Trending towards the high side. Labs okay Creatinine on admission 1.15, today is 1.10. Intake/Output from previous day: 09/11 0701 - 09/12 0700 In: 1200 [I.V.:1200] Out: 275 [Urine:250; Blood:25] Intake/Output this shift: Total I/O In: -  Out: 250 [Urine:250]  General appearance: alert, cooperative and no distress GI: soft, sore, sites look fine.  Lab Results:  Recent Labs    09/15/17 1550 09/16/17 0434  WBC 8.8 9.9  HGB 13.0 13.4  HCT 39.2 39.8  PLT 358 364    BMET Recent Labs    09/15/17 1550 09/16/17 0434  NA 141 139  K 3.7 3.8  CL 105 102  CO2 26 25  GLUCOSE 82 102*  BUN 11 10  CREATININE 1.15 1.10  CALCIUM 9.4 9.3   PT/INR No results for input(s): LABPROT, INR in the last 72 hours.  Recent Labs  Lab 09/15/17 1550  AST 20  ALT 12  ALKPHOS 70  BILITOT 0.6  PROT 7.0  ALBUMIN 3.8     Lipase     Component Value Date/Time   LIPASE 27 09/15/2017 1550     Prior to Admission medications   Medication Sig Start Date End Date Taking? Authorizing Provider  amoxicillin (AMOXIL) 400 MG/5ML suspension Take 10 mLs (800 mg total) by mouth 2 (two) times daily. 09/27/14   Wallis BambergMani, Mario, PA-C    Medications: . acetaminophen (TYLENOL) oral  liquid 160 mg/5 mL  650 mg Oral Q6H  . docusate sodium  200 mg Oral BID  . heparin injection (subcutaneous)  5,000 Units Subcutaneous Q8H     Assessment/Plan  Acute appendicitis Laparoscopic appendectomy, 09/15/2017 Dr. Marin Olphristopher White  FEN: Regular diet ID: Ceftin Flagyl preop DVT: Heparin Follow-up: [facility]   Plan:  Home today, follow up in the [facility].        LOS: 0 days    Carmelina Balducci 09/16/2017 [phone]

## 2017-09-17 NOTE — Discharge Summary (Signed)
Physician Discharge Summary  Patient ID: Kwan Shellhammer MRN: [id number] DOB/AGE: [birth year] 27 y.o.  Admit date: 09/15/2017 Discharge date: 09/16/2017  Admission Diagnoses:  Acute appendicitis  Discharge Diagnoses:  Acute appendicitis  Active Problems:   S/P laparoscopic appendectomy   PROCEDURES: Laparoscopic appendectomy, 09/15/2017 Dr. Mills Koller Course: CC: RLQ pain; consulted by EDP for acute appendicitis HPI: Norm Wray is an 27 y.o. male who denies any prior medical hx presented to the ED with a 3d hx of RLQ abdominal pain. Wasn't getting worse but wasn't getting better so he came in at the urging of his boss for further evaluation. Denies fevers; +chills. Denies n/v. Never had this before. The pain does not radiate. The pain is somewhat sharp. Nothing makes it better/worse.  He was seen by Dr. Cliffton Asters and taken to the OR for the above procedure.  He did well post op and was discharged home the first post op day tolerating a diet and pain well controlled with PO medications.    CBC Latest Ref Rng & Units 09/16/2017 09/15/2017  WBC 4.0 - 10.5 K/uL 9.9 8.8  Hemoglobin 13.0 - 17.0 g/dL 46.9 62.9  Hematocrit 52.8 - 52.0 % 39.8 39.2  Platelets 150 - 400 K/uL 364 358   CMP Latest Ref Rng & Units 09/16/2017 09/15/2017  Glucose 70 - 99 mg/dL 413(K) 82  BUN 6 - 20 mg/dL 10 11  Creatinine 4.40 - 1.24 mg/dL 1.02 7.25  Sodium 366 - 145 mmol/L 139 141  Potassium 3.5 - 5.1 mmol/L 3.8 3.7  Chloride 98 - 111 mmol/L 102 105  CO2 22 - 32 mmol/L 25 26  Calcium 8.9 - 10.3 mg/dL 9.3 9.4  Total Protein 6.5 - 8.1 g/dL - 7.0  Total Bilirubin 0.3 - 1.2 mg/dL - 0.6  Alkaline Phos 38 - 126 U/L - 70  AST 15 - 41 U/L - 20  ALT 0 - 44 U/L - 12   Condition on DC:  Improved   Disposition:    Allergies as of 09/16/2017   No Known Allergies     Medication List    STOP taking these medications   amoxicillin 400 MG/5ML suspension Commonly known as:  AMOXIL     TAKE  these medications   acetaminophen 160 MG/5ML solution Commonly known as:  TYLENOL You can take 1000 mg of Tylenol every 6 hours as needed for pain.  You can buy this over the counter at any drugstore.   Do not exceed 4000 mg per day, it can harm your liver.   oxyCODONE 5 MG/5ML solution Commonly known as:  ROXICODONE Take 5 mLs (5 mg total) by mouth every 4 (four) hours as needed for severe pain or breakthrough pain.      Follow-up Information    [facility] Follow up on 09/30/2017.   Specialty:  General Surgery Why:  Your appointment is at 2:00  PM, be at the office 30 minutes early for check in.  Bring photo ID and insurance information. Contact information: [address] [place] [address] [phone]        Get a primary care doctor to follow your blood pressure. Follow up.   Why:  Take you blood pressure at home a couple times per day and record. Do it when you are not stressed.  Early AM and in the PM while relaxing.  Use this when you see your primary care doctor.  SignedSherrie George: Navea Woodrow 09/17/2017, 8:42 AM

## 2019-08-12 IMAGING — CT CT ABD-PELV W/ CM
2 of 4 series · 17 of 46 positions shown, 19 images · IV contrast (omnipaque)
Comparison: None.

CLINICAL DATA: Right lower quadrant abdominal pain starting on
[REDACTED].

EXAM:
CT ABDOMEN AND PELVIS WITH CONTRAST
TECHNIQUE: Multidetector CT imaging of the abdomen and pelvis was performed
using the standard protocol following bolus administration of
intravenous contrast.
CONTRAST:  100mL OMNIPAQUE IOHEXOL 300 MG/ML  SOLN

[Series 3: abd/ pelvis 5.0 i30f 2 · axial · 0.72mm/px · z∈[-366,+44]mm · 14 of 90 slices shown, 16 images]
[im 4/90  soft-tissue]
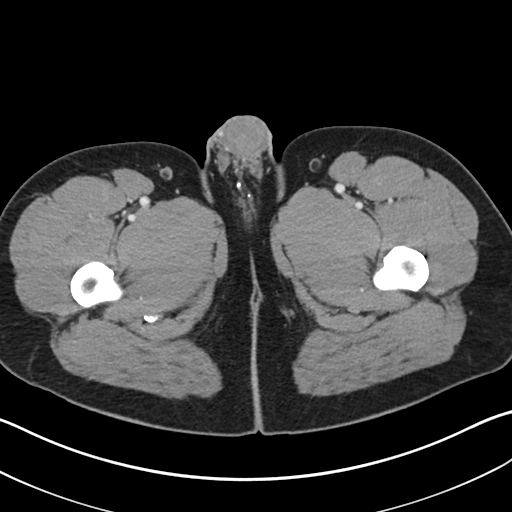
[im 4/90  bone]
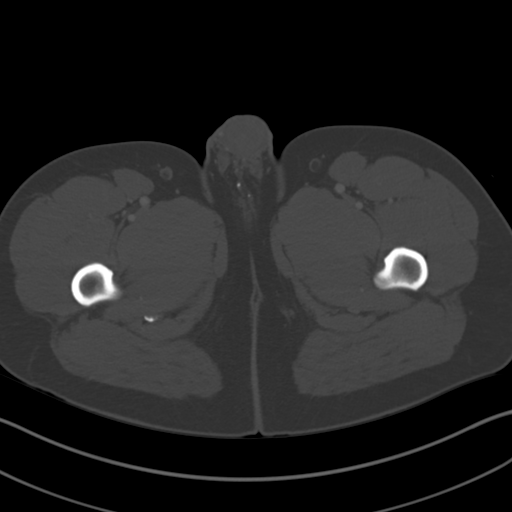
[im 11/90  soft-tissue]
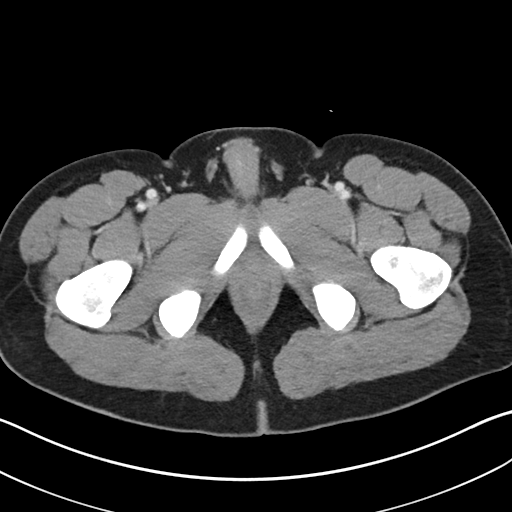
[im 18/90  soft-tissue]
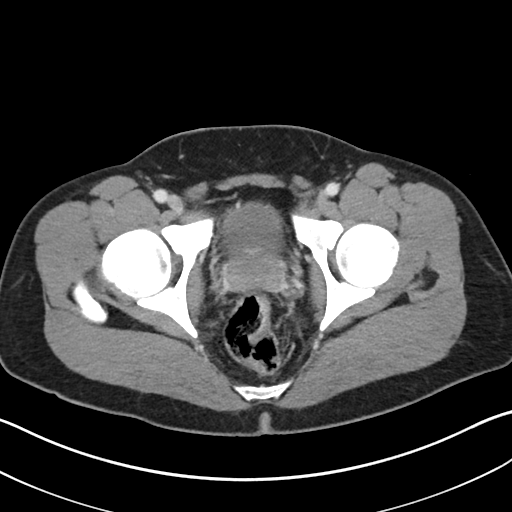
[im 25/90  soft-tissue]
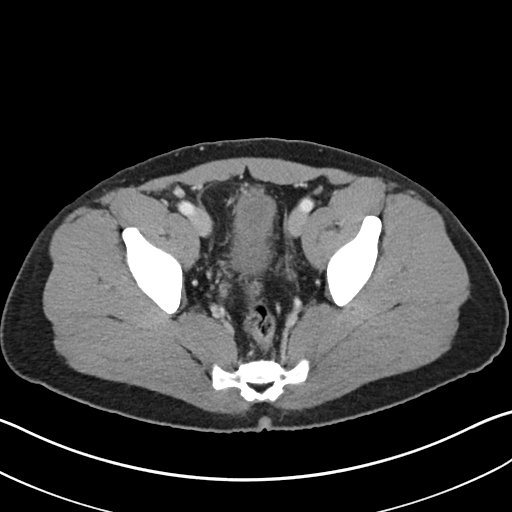
[im 29/90  soft-tissue]
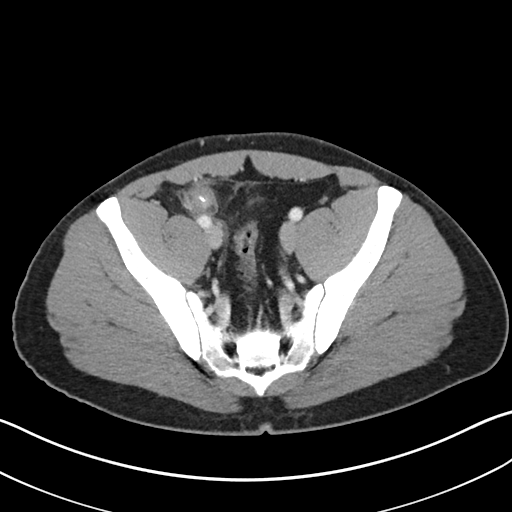
[im 36/90  soft-tissue]
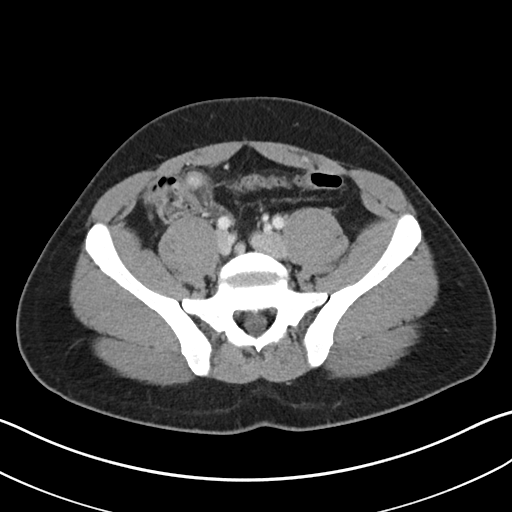
[im 43/90  soft-tissue]
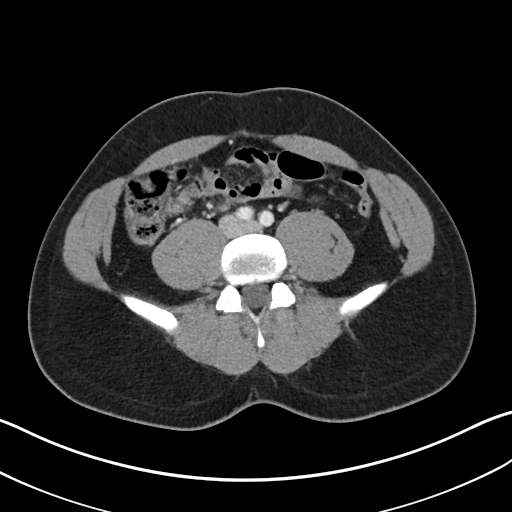
[im 47/90  soft-tissue]
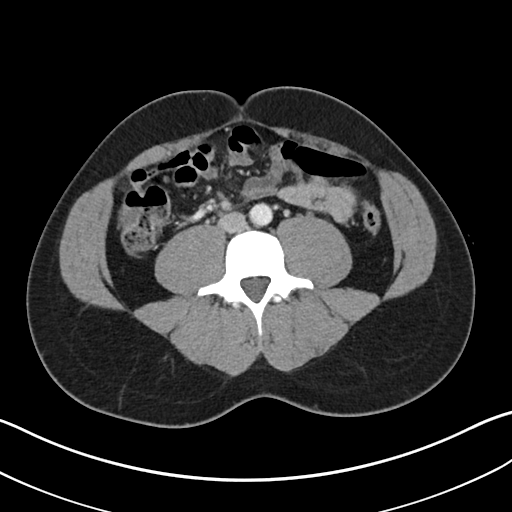
[im 54/90  soft-tissue]
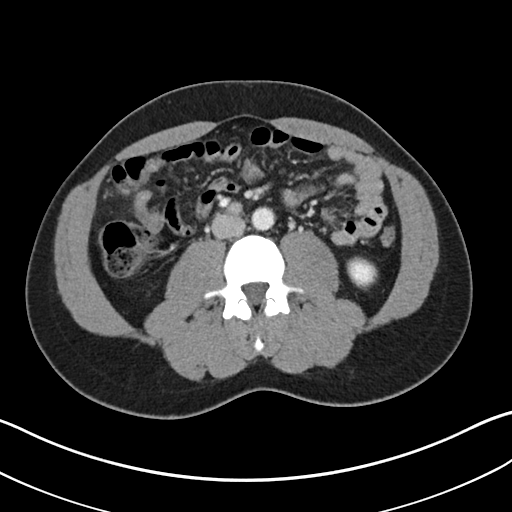
[im 54/90  bone]
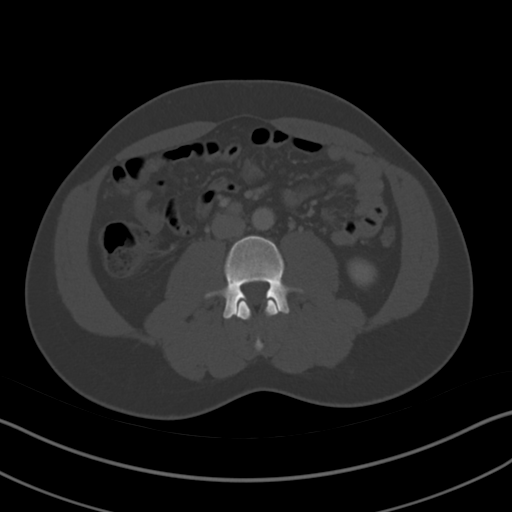
[im 61/90  soft-tissue]
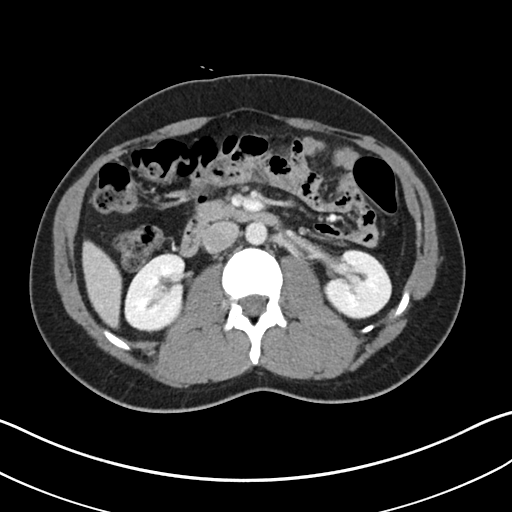
[im 68/90  soft-tissue]
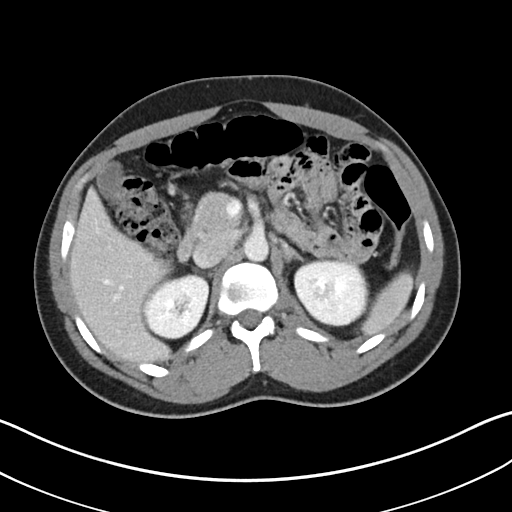
[im 72/90  soft-tissue]
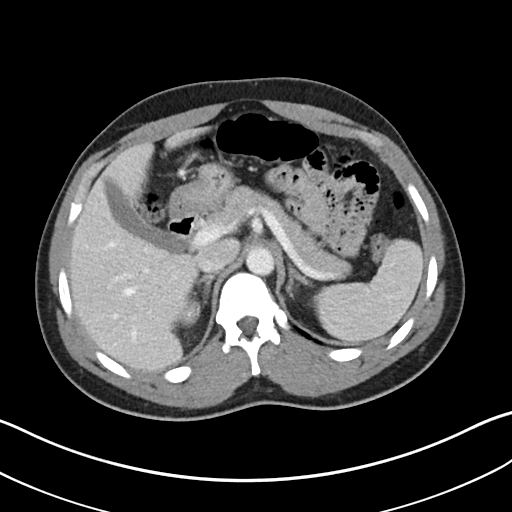
[im 79/90  soft-tissue]
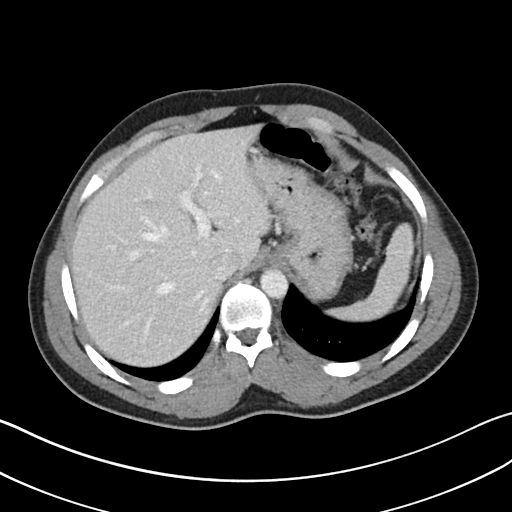
[im 86/90  soft-tissue]
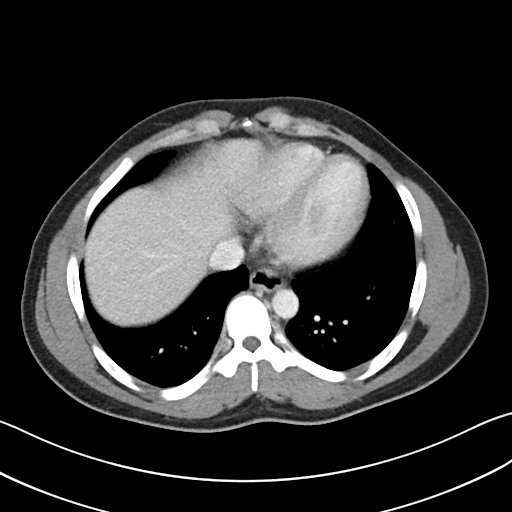

[Series 6: coronal soft tissue · coronal · 0.79mm/px · 3 of 91 slices shown]
[im 31/91  soft-tissue]
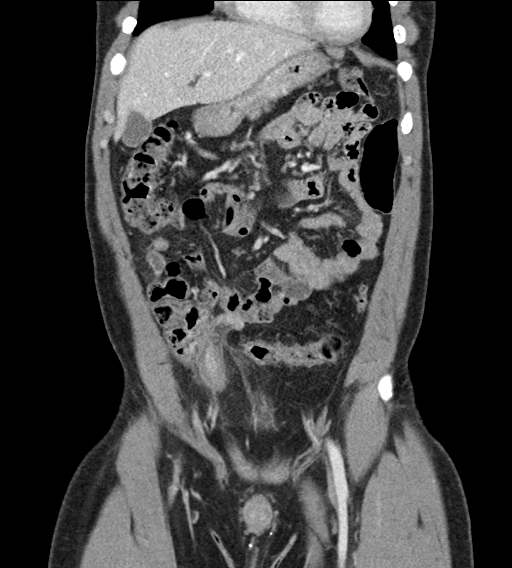
[im 41/91  soft-tissue]
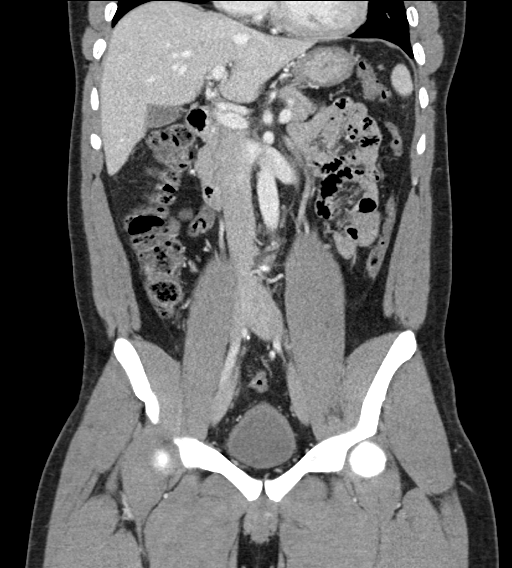
[im 51/91  soft-tissue]
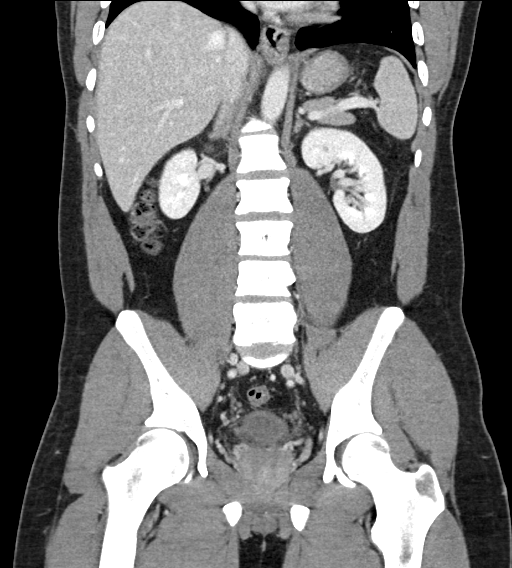

[17 of 46 positions shown; findings below may reference images not displayed]

FINDINGS: Lower chest: Mild cardiomegaly.

Hepatobiliary: Unremarkable

Pancreas: Unremarkable

Spleen: Unremarkable

Adrenals/Urinary Tract: Unremarkable

Stomach/Bowel: Acute appendicitis is present with appendiceal
diameter of 2.1 cm, and with a 1.0 cm appendicolith in the
appendiceal tip. There is inflammatory periappendiceal stranding and
the degree of inflammation is most pronounced near the appendiceal
tip. Small amount of periappendiceal fluid. No discrete abscess or
extraluminal gas.

Vascular/Lymphatic: Unremarkable

Reproductive: Unremarkable

Other: No supplemental non-categorized findings.

Musculoskeletal: Unremarkable
IMPRESSION: 1. Acute appendicitis, with prominent inflammatory thickening of the
distal appendix and a notable appendicolith near the appendiceal
tip. There is surrounding periappendiceal inflammatory stranding and
a small amount of adjacent fluid, but no extraluminal gas or
abscess.
2. Mild cardiomegaly.
